# Patient Record
Sex: Male | Born: 1970 | Hispanic: No | Marital: Married | Smoking: Former smoker
Health system: Southern US, Community
[De-identification: ages and names within clinical notes are randomized; demographics above are authoritative.]

## PROBLEM LIST (undated history)

## (undated) DIAGNOSIS — Z87442 Personal history of urinary calculi: Secondary | ICD-10-CM

## (undated) DIAGNOSIS — Z8489 Family history of other specified conditions: Secondary | ICD-10-CM

## (undated) HISTORY — PX: ABDOMINAL SURGERY: SHX537

---
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
## 2013-03-04 DIAGNOSIS — D229 Melanocytic nevi, unspecified: Secondary | ICD-10-CM

## 2013-03-04 HISTORY — DX: Melanocytic nevi, unspecified: D22.9

## 2017-01-11 DIAGNOSIS — C4491 Basal cell carcinoma of skin, unspecified: Secondary | ICD-10-CM

## 2017-01-11 HISTORY — DX: Basal cell carcinoma of skin, unspecified: C44.91

## 2019-11-05 ENCOUNTER — Other Ambulatory Visit: Payer: Self-pay

## 2019-11-05 ENCOUNTER — Encounter: Payer: Self-pay | Admitting: Physician Assistant

## 2019-11-05 ENCOUNTER — Encounter (INDEPENDENT_AMBULATORY_CARE_PROVIDER_SITE_OTHER): Payer: Self-pay

## 2019-11-05 ENCOUNTER — Ambulatory Visit (INDEPENDENT_AMBULATORY_CARE_PROVIDER_SITE_OTHER): Payer: 59 | Admitting: Physician Assistant

## 2019-11-05 DIAGNOSIS — C44519 Basal cell carcinoma of skin of other part of trunk: Secondary | ICD-10-CM | POA: Diagnosis not present

## 2019-11-05 DIAGNOSIS — D492 Neoplasm of unspecified behavior of bone, soft tissue, and skin: Secondary | ICD-10-CM

## 2019-11-05 DIAGNOSIS — L570 Actinic keratosis: Secondary | ICD-10-CM | POA: Diagnosis not present

## 2019-11-05 NOTE — Patient Instructions (Signed)

## 2019-11-05 NOTE — Progress Notes (Addendum)
Follow-Up Visit   Subjective  Mathew Ramos is a 49 y.o. male who presents for the following: Skin Problem (Right cheek had for one month, no scal but does bleed everytime he shaves. Left shoulder had for 2 months, scaly but wont heal).  Location: back x 2, right cheek Duration: 1 month Quality: pink and scaly, non healing  Associated Signs/Symptoms: tender, bleeds Modifying Factors: persistent  The following portions of the chart were reviewed this encounter and updated as appropriate: Allergies  Meds  Problems  Med Hx  Surg Hx  Fam Hx  Soc Hx     Objective  Well appearing patient in no apparent distress; mood and affect are within normal limits.  All skin waist up examined. No atypical nevi noted at the time of the visit.  Right Parotid Area  Objective  Right Parotid Area: Scaly erythematous macule  Txpbx-curet& cautery 1.1cm  Images    Objective  Left Upper Back medial: Scaly erythematous macule  Txpbx-curet and cautery 1.1cm       Objective  left upper back lateral: Scaly erythematous macule  Txpbx-curet and cautery  1.1cm     Assessment & Plan  Actinic keratosis Right Parotid Area  Skin / nail biopsy - Right Parotid Area Type of biopsy: tangential   Informed consent: discussed and consent obtained   Timeout: patient name, date of birth, surgical site, and procedure verified   Procedure prep:  Patient was prepped and draped in usual sterile fashion Prep type:  Chlorhexidine Anesthesia: the lesion was anesthetized in a standard fashion   Anesthetic:  1% lidocaine w/ epinephrine 1-100,000 local infiltration Instrument used: flexible razor blade   Hemostasis achieved with: aluminum chloride   Outcome: patient tolerated procedure well   Post-procedure details: wound care instructions given   Additional details:  PERFORMED ED&C AFTER BIOPSY   Destruction of lesion - Right Parotid Area Complexity: extensive   Destruction method:  electrodesiccation and curettage   Informed consent: discussed and consent obtained   Timeout:  patient name, date of birth, surgical site, and procedure verified Procedure prep:  Patient was prepped and draped in usual sterile fashion Prep type:  Isopropyl alcohol Anesthesia: the lesion was anesthetized in a standard fashion   Anesthetic:  1% lidocaine w/ epinephrine 1-100,000 buffered w/ 8.4% NaHCO3 Curettage performed in three different directions: Yes   Electrodesiccation performed over the curetted area: Yes   Lesion length (cm):  1.1 Lesion width (cm):  1.1 Margin per side (cm):  0 Final wound size (cm):  1.1 Hemostasis achieved with:  pressure, aluminum chloride and electrodesiccation Outcome: patient tolerated procedure well with no complications   Post-procedure details: sterile dressing applied and wound care instructions given   Dressing type: bandage and petrolatum    Specimen 1 - Surgical pathology Differential Diagnosis: bcc Check Margins: No  BCC (basal cell carcinoma), back (2) Left Upper Back medial  Skin / nail biopsy Type of biopsy: tangential   Informed consent: discussed and consent obtained   Timeout: patient name, date of birth, surgical site, and procedure verified   Procedure prep:  Patient was prepped and draped in usual sterile fashion Prep type:  Chlorhexidine Anesthesia: the lesion was anesthetized in a standard fashion   Anesthetic:  1% lidocaine w/ epinephrine 1-100,000 local infiltration Instrument used: flexible razor blade   Hemostasis achieved with: aluminum chloride   Outcome: patient tolerated procedure well   Post-procedure details: wound care instructions given   Additional details:  PERFORMED ED&C AFTER BIOPSY  Destruction of lesion Complexity: extensive   Destruction method: electrodesiccation and curettage   Informed consent: discussed and consent obtained   Timeout:  patient name, date of birth, surgical site, and procedure  verified Procedure prep:  Patient was prepped and draped in usual sterile fashion Prep type:  Isopropyl alcohol Anesthesia: the lesion was anesthetized in a standard fashion   Anesthetic:  1% lidocaine w/ epinephrine 1-100,000 buffered w/ 8.4% NaHCO3 Curettage performed in three different directions: Yes   Electrodesiccation performed over the curetted area: Yes   Lesion length (cm):  1.1 Lesion width (cm):  1.1 Margin per side (cm):  0 Final wound size (cm):  1.1 Hemostasis achieved with:  pressure, aluminum chloride and electrodesiccation Outcome: patient tolerated procedure well with no complications   Post-procedure details: sterile dressing applied and wound care instructions given   Dressing type: bandage and petrolatum    Specimen 2 - Surgical pathology Differential Diagnosis: bcc Check Margins: No  left upper back lateral  Skin / nail biopsy Type of biopsy: tangential   Informed consent: discussed and consent obtained   Timeout: patient name, date of birth, surgical site, and procedure verified   Procedure prep:  Patient was prepped and draped in usual sterile fashion Prep type:  Chlorhexidine Anesthesia: the lesion was anesthetized in a standard fashion   Anesthetic:  1% lidocaine w/ epinephrine 1-100,000 local infiltration Instrument used: flexible razor blade   Hemostasis achieved with: aluminum chloride   Outcome: patient tolerated procedure well   Post-procedure details: wound care instructions given   Additional details:  PERFORMED ED&C AFTER BIOPSY   Destruction of lesion Complexity: extensive   Destruction method: electrodesiccation and curettage   Informed consent: discussed and consent obtained   Timeout:  patient name, date of birth, surgical site, and procedure verified Procedure prep:  Patient was prepped and draped in usual sterile fashion Prep type:  Isopropyl alcohol Anesthesia: the lesion was anesthetized in a standard fashion   Anesthetic:  1%  lidocaine w/ epinephrine 1-100,000 buffered w/ 8.4% NaHCO3 Curettage performed in three different directions: Yes   Electrodesiccation performed over the curetted area: Yes   Lesion length (cm):  1.1 Lesion width (cm):  1.1 Margin per side (cm):  0 Final wound size (cm):  1.1 Hemostasis achieved with:  pressure, aluminum chloride and electrodesiccation Outcome: patient tolerated procedure well with no complications   Post-procedure details: sterile dressing applied and wound care instructions given   Dressing type: bandage and petrolatum    Specimen 3 - Surgical pathology Differential Diagnosis: bcc Check Margins: No  Other Related Procedures Skin / nail biopsy Type of biopsy: tangential   Informed consent: discussed and consent obtained   Timeout: patient name, date of birth, surgical site, and procedure verified   Procedure prep:  Patient was prepped and draped in usual sterile fashion Prep type:  Chlorhexidine Anesthesia: the lesion was anesthetized in a standard fashion   Anesthetic:  1% lidocaine w/ epinephrine 1-100,000 local infiltration Instrument used: flexible razor blade   Hemostasis achieved with: aluminum chloride   Outcome: patient tolerated procedure well   Post-procedure details: wound care instructions given   Additional details:  PERFORMED ED&C AFTER BIOPSY  Destruction of lesion Complexity: extensive   Destruction method: electrodesiccation and curettage   Informed consent: discussed and consent obtained   Timeout:  patient name, date of birth, surgical site, and procedure verified Procedure prep:  Patient was prepped and draped in usual sterile fashion Prep type:  Isopropyl alcohol Anesthesia: the  lesion was anesthetized in a standard fashion   Anesthetic:  1% lidocaine w/ epinephrine 1-100,000 buffered w/ 8.4% NaHCO3 Curettage performed in three different directions: Yes   Electrodesiccation performed over the curetted area: Yes   Lesion length (cm):   1.1 Lesion width (cm):  1.1 Margin per side (cm):  0 Final wound size (cm):  1.1 Hemostasis achieved with:  pressure, aluminum chloride and electrodesiccation Outcome: patient tolerated procedure well with no complications   Post-procedure details: sterile dressing applied and wound care instructions given   Dressing type: bandage and petrolatum

## 2019-11-09 ENCOUNTER — Telehealth: Payer: Self-pay

## 2019-11-09 NOTE — Telephone Encounter (Signed)
-----   Message from Warren Danes, [place] sent at 11/06/2019  3:34 PM EDT ----- 1. Skin , right parotid area ACTINIC KERATOSIS 2. Skin , left upper back medial SUPERFICIAL BASAL CELL CARCINOMA 3. Skin , left upper back lateral BASAL CELL CARCINOMA, NODULAR PATTERN  All txpbx  recheck 6 months

## 2019-11-09 NOTE — Telephone Encounter (Signed)
Phone call to patient with his Pathology results.  Message left for patient to give [place] a call back.

## 2019-11-09 NOTE — Telephone Encounter (Signed)
Phone call from patient returning my call for his Pathology results.  Pathology results given to patient, 6 month follow up appointment made with [facility] on 04/26/2020 @ 8:15am.

## 2019-12-28 ENCOUNTER — Ambulatory Visit: Payer: 59 | Attending: Internal Medicine

## 2019-12-28 DIAGNOSIS — Z23 Encounter for immunization: Secondary | ICD-10-CM

## 2019-12-28 NOTE — Progress Notes (Signed)
   Covid-19 Vaccination Clinic  Name:  Mathew Ramos    MRN: [id number] DOB: 1971-03-12  12/28/2019  Mr. Mathew Ramos was observed post Covid-19 immunization for 15 minutes without incident. He was provided with Vaccine Information Sheet and instruction to access the V-Safe system.   Mr. Mathew Ramos was instructed to call 911 with any severe reactions post vaccine: Marland Kitchen Difficulty breathing  . Swelling of face and throat  . A fast heartbeat  . A bad rash all over body  . Dizziness and weakness   Immunizations Administered    Name Date Dose VIS Date Route   Pfizer COVID-19 Vaccine 12/28/2019  3:05 PM 0.3 mL 10/07/2018 Intramuscular   Manufacturer: [place]   Lot: [id number]   [place]: [id number]

## 2019-12-31 NOTE — Addendum Note (Signed)
Addended by: Robyne Askew R on: 12/31/2019 02:03 PM   Modules accepted: Level of Service

## 2020-01-18 ENCOUNTER — Ambulatory Visit: Payer: 59 | Attending: Internal Medicine

## 2020-01-18 DIAGNOSIS — Z23 Encounter for immunization: Secondary | ICD-10-CM

## 2020-01-18 NOTE — Progress Notes (Signed)
   Covid-19 Vaccination Clinic  Name:  Mathew Ramos    MRN: [id number] DOB: [birth year]  01/18/2020  [name] was observed post Covid-19 immunization for 15 minutes without incident. He was provided with Vaccine Information Sheet and instruction to access the V-Safe system.   [name] was instructed to call 911 with any severe reactions post vaccine: Marland Kitchen Difficulty breathing  . Swelling of face and throat  . A fast heartbeat  . A bad rash all over body  . Dizziness and weakness   Immunizations Administered    Name Date Dose VIS Date Route   Pfizer COVID-19 Vaccine 01/18/2020  1:27 PM 0.3 mL 10/07/2018 Intramuscular   Manufacturer: [employer]   Lot: [id number]   [place]: [id number]

## 2020-04-26 ENCOUNTER — Ambulatory Visit: Payer: 59 | Admitting: Physician Assistant

## 2020-06-29 ENCOUNTER — Ambulatory Visit: Payer: 59 | Admitting: Dermatology

## 2020-10-10 ENCOUNTER — Other Ambulatory Visit: Payer: Self-pay

## 2020-10-10 ENCOUNTER — Ambulatory Visit (INDEPENDENT_AMBULATORY_CARE_PROVIDER_SITE_OTHER): Payer: Managed Care, Other (non HMO) | Admitting: Physician Assistant

## 2020-10-10 ENCOUNTER — Encounter: Payer: Self-pay | Admitting: Physician Assistant

## 2020-10-10 DIAGNOSIS — Z86018 Personal history of other benign neoplasm: Secondary | ICD-10-CM | POA: Diagnosis not present

## 2020-10-10 DIAGNOSIS — L821 Other seborrheic keratosis: Secondary | ICD-10-CM

## 2020-10-10 DIAGNOSIS — C4492 Squamous cell carcinoma of skin, unspecified: Secondary | ICD-10-CM

## 2020-10-10 DIAGNOSIS — D0439 Carcinoma in situ of skin of other parts of face: Secondary | ICD-10-CM | POA: Diagnosis not present

## 2020-10-10 DIAGNOSIS — L570 Actinic keratosis: Secondary | ICD-10-CM

## 2020-10-10 DIAGNOSIS — Z85828 Personal history of other malignant neoplasm of skin: Secondary | ICD-10-CM

## 2020-10-10 DIAGNOSIS — D0430 Carcinoma in situ of skin of unspecified part of face: Secondary | ICD-10-CM

## 2020-10-10 DIAGNOSIS — D485 Neoplasm of uncertain behavior of skin: Secondary | ICD-10-CM

## 2020-10-10 HISTORY — DX: Squamous cell carcinoma of skin, unspecified: C44.92

## 2020-10-10 NOTE — Patient Instructions (Signed)

## 2020-10-17 ENCOUNTER — Telehealth: Payer: Self-pay | Admitting: *Deleted

## 2020-10-17 ENCOUNTER — Encounter: Payer: Self-pay | Admitting: *Deleted

## 2020-10-17 ENCOUNTER — Encounter: Payer: Self-pay | Admitting: Physician Assistant

## 2020-10-17 NOTE — Telephone Encounter (Signed)
Pathology to patient-surgery appointment scheduled.  

## 2020-10-17 NOTE — Progress Notes (Signed)
Follow-Up Visit   Subjective  Mathew Ramos is a 50 y.o. male who presents for the following: Follow-up (Follow up on bcc on shoulder, patient doesn't remember left or right. A couple of spots on face. Left side of temple-scaly, crusty, wont heal x 6 months./Bridge of nose-scaly crusty places that wont heal x 6 months. Per patient Left side of the nose x months depression in face ).   The following portions of the chart were reviewed this encounter and updated as appropriate:  Tobacco  Allergies  Meds  Problems  Med Hx  Surg Hx  Fam Hx      Objective  Well appearing patient in no apparent distress; mood and affect are within normal limits.  All skin waist up examined.  Objective  Left Nasal Sidewall: Pearly papule with telangectasia.        Objective  Left Malar Cheek: Pearly papule with telangectasia.        Objective  Mid Upper Back: scar  Objective  Right Upper Thigh: Dyspigmented scar.   Objective  Right Bridge of Nose: Pearly papule with telangectasia.        Objective  Left Temple: Pearly papule with telangectasia.        Assessment & Plan  Neoplasm of uncertain behavior of skin (2) Left Nasal Sidewall  Skin / nail biopsy Type of biopsy: tangential   Informed consent: discussed and consent obtained   Timeout: patient name, date of birth, surgical site, and procedure verified   Procedure prep:  Patient was prepped and draped in usual sterile fashion (Non sterile) Prep type:  Chlorhexidine Anesthesia: the lesion was anesthetized in a standard fashion   Anesthetic:  1% lidocaine w/ epinephrine 1-100,000 local infiltration Instrument used: flexible razor blade   Hemostasis achieved with: aluminum chloride   Outcome: patient tolerated procedure well   Post-procedure details: sterile dressing applied and wound care instructions given   Dressing type: bandage and petrolatum    Specimen 3 - Surgical pathology Differential  Diagnosis: R/O BCC vs SCC  Check Margins: No  Left Malar Cheek  Skin / nail biopsy Type of biopsy: tangential   Informed consent: discussed and consent obtained   Timeout: patient name, date of birth, surgical site, and procedure verified   Procedure prep:  Patient was prepped and draped in usual sterile fashion (Non sterile) Prep type:  Chlorhexidine Anesthesia: the lesion was anesthetized in a standard fashion   Anesthetic:  1% lidocaine w/ epinephrine 1-100,000 local infiltration Instrument used: flexible razor blade   Hemostasis achieved with: aluminum chloride   Outcome: patient tolerated procedure well   Post-procedure details: sterile dressing applied and wound care instructions given   Dressing type: bandage and petrolatum    Specimen 4 - Surgical pathology Differential Diagnosis: R/O BCC vs SCC  Check Margins: No  History of basal cell carcinoma (BCC) Mid Upper Back  observe  History of atypical skin mole Right Upper Thigh  observe  Carcinoma in situ of skin of face, 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  Skin / nail biopsy Type of biopsy: tangential   Informed consent: discussed and consent obtained   Timeout: patient name, date of birth, surgical site, and procedure verified   Procedure prep:  Patient was prepped and draped in usual sterile fashion (Non sterile) Prep type:  Chlorhexidine Anesthesia: the lesion was anesthetized in a standard fashion   Anesthetic:  1% lidocaine w/ epinephrine 1-100,000 local infiltration Instrument used: flexible razor blade   Hemostasis achieved  with: aluminum chloride   Outcome: patient tolerated procedure well   Post-procedure details: sterile dressing applied and wound care instructions given   Dressing type: bandage and petrolatum    Specimen 1 - Surgical pathology Differential Diagnosis: R/O BCC vs SCC  Check Margins: No  Left Temple  Skin / nail biopsy Type of biopsy: tangential   Informed consent:  discussed and consent obtained   Timeout: patient name, date of birth, surgical site, and procedure verified   Procedure prep:  Patient was prepped and draped in usual sterile fashion (Non sterile) Prep type:  Chlorhexidine Anesthesia: the lesion was anesthetized in a standard fashion   Anesthetic:  1% lidocaine w/ epinephrine 1-100,000 local infiltration Instrument used: flexible razor blade   Hemostasis achieved with: aluminum chloride   Outcome: patient tolerated procedure well   Post-procedure details: sterile dressing applied and wound care instructions given   Dressing type: bandage and petrolatum    Specimen 2 - Surgical pathology Differential Diagnosis: R/O BCC vs SCC  Check Margins: No   I, Juanluis Guastella, PA-C, have reviewed all documentation's for this visit.  The documentation on 10/17/20 for the exam, diagnosis, procedures and orders are all accurate and complete.

## 2021-01-12 ENCOUNTER — Other Ambulatory Visit: Payer: Self-pay

## 2021-01-12 ENCOUNTER — Ambulatory Visit (INDEPENDENT_AMBULATORY_CARE_PROVIDER_SITE_OTHER): Payer: Managed Care, Other (non HMO) | Admitting: Physician Assistant

## 2021-01-12 ENCOUNTER — Encounter: Payer: Self-pay | Admitting: Physician Assistant

## 2021-01-12 DIAGNOSIS — D0439 Carcinoma in situ of skin of other parts of face: Secondary | ICD-10-CM

## 2021-01-12 DIAGNOSIS — D0430 Carcinoma in situ of skin of unspecified part of face: Secondary | ICD-10-CM

## 2021-01-12 NOTE — Progress Notes (Signed)
   Follow-Up Visit   Subjective  Mathew Ramos is a 50 y.o. male who presents for the following: Procedure (Cis x2 path in room).   The following portions of the chart were reviewed this encounter and updated as appropriate:      Objective  Well appearing patient in no apparent distress; mood and affect are within normal limits.  A focused examination was performed including face. Relevant physical exam findings are noted in the Assessment and Plan.  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  Destruction of lesion Complexity: simple   Destruction method: electrodesiccation and curettage   Informed consent: discussed and consent obtained   Timeout:  patient name, date of birth, surgical site, and procedure verified Anesthesia: the lesion was anesthetized in a standard fashion   Anesthetic:  1% lidocaine w/ epinephrine 1-100,000 local infiltration Curettage performed in three different directions: Yes   Electrodesiccation performed over the curetted area: Yes   Curettage cycles:  3 Margin per side (cm):  0.1 Final wound size (cm):  1.2 Hemostasis achieved with:  ferric subsulfate and electrodesiccation Outcome: patient tolerated procedure well with no complications   Post-procedure details: sterile dressing applied and wound care instructions given   Dressing type: bandage and petrolatum   Additional details:  Wound innoculated with 5 fluorouracil solution.  Left Temple  Destruction of lesion Complexity: simple   Destruction method: electrodesiccation and curettage   Informed consent: discussed and consent obtained   Timeout:  patient name, date of birth, surgical site, and procedure verified Anesthesia: the lesion was anesthetized in a standard fashion   Anesthetic:  1% lidocaine w/ epinephrine 1-100,000 local  infiltration Curettage performed in three different directions: Yes   Electrodesiccation performed over the curetted area: Yes   Curettage cycles:  3 Margin per side (cm):  0.1 Final wound size (cm):  1 Hemostasis achieved with:  ferric subsulfate Outcome: patient tolerated procedure well with no complications   Post-procedure details: sterile dressing applied and wound care instructions given   Dressing type: bandage and petrolatum   Additional details:  Wound inoculated with 5% fluorouracil solution   I, Cherish Runde, PA-C, have reviewed all documentation's for this visit.  The documentation on 01/12/21 for the exam, diagnosis, procedures and orders are all accurate and complete.

## 2021-01-12 NOTE — Patient Instructions (Signed)

## 2021-01-17 ENCOUNTER — Other Ambulatory Visit: Payer: Self-pay

## 2021-01-17 ENCOUNTER — Emergency Department (HOSPITAL_BASED_OUTPATIENT_CLINIC_OR_DEPARTMENT_OTHER): Payer: Managed Care, Other (non HMO) | Admitting: Radiology

## 2021-01-17 ENCOUNTER — Emergency Department (HOSPITAL_BASED_OUTPATIENT_CLINIC_OR_DEPARTMENT_OTHER)
Admission: EM | Admit: 2021-01-17 | Discharge: 2021-01-17 | Disposition: A | Discharge status: Home / Self Care | Payer: Managed Care, Other (non HMO) | Attending: Emergency Medicine | Admitting: Emergency Medicine

## 2021-01-17 ENCOUNTER — Encounter (HOSPITAL_BASED_OUTPATIENT_CLINIC_OR_DEPARTMENT_OTHER): Payer: Self-pay

## 2021-01-17 DIAGNOSIS — Y9389 Activity, other specified: Secondary | ICD-10-CM | POA: Diagnosis not present

## 2021-01-17 DIAGNOSIS — Z87891 Personal history of nicotine dependence: Secondary | ICD-10-CM | POA: Diagnosis not present

## 2021-01-17 DIAGNOSIS — W312XXA Contact with powered woodworking and forming machines, initial encounter: Secondary | ICD-10-CM | POA: Insufficient documentation

## 2021-01-17 DIAGNOSIS — Z85828 Personal history of other malignant neoplasm of skin: Secondary | ICD-10-CM | POA: Diagnosis not present

## 2021-01-17 DIAGNOSIS — S61211A Laceration without foreign body of left index finger without damage to nail, initial encounter: Secondary | ICD-10-CM | POA: Diagnosis present

## 2021-01-17 NOTE — Discharge Instructions (Addendum)
Wound care as we discussed.  Apply antibiotic ointment at least once daily with nonadherent dressing.  Wash with soap and water in between.  You can make an appointment to follow-up with hand surgery but most likely this wound will heal fine on its own.

## 2021-01-17 NOTE — ED Triage Notes (Signed)
Cut left index finger on a radial arm saw last night at 1800, not currently bleeding.

## 2021-01-17 NOTE — ED Notes (Signed)
Laceration to left index finger irrigated with 100 ml NS, antibiotic ointment applied, non adherent dressing applied, wrapped with kerlix and coban.  Patient educated on wound care and dressing changes, verbalized understanding.

## 2021-01-17 NOTE — ED Provider Notes (Signed)
[facility] EMERGENCY DEPT Provider Note   CSN: [id number] Arrival date & time: 01/17/21  3664     History Chief Complaint  Patient presents with  . Laceration    Mathew Ramos is a 50 y.o. male.  Patient last evening was cutting wood with radial salt.  And he is took a chunk of skin out of his radial side of his left index finger.  He is right-hand dominant.  Patient's tetanus is up-to-date.  Bleeding controlled with pressure at home.  Patient thinks that actually the skin was actually removed from the radial salt.  Because it was a skiving type injury.  No other injuries.        Past Medical History:  Diagnosis Date  . Atypical mole 03/04/2013   moderate-R post shoulder, mod-severe-Right upper thingh(exc)  . Atypical mole 08/21/2013   mild-Left post calf  . Atypical mole 08/03/2014   mild- R outer calf  . Basal cell carcinoma 01/11/2017   sup-mid upper back (CX35FU)  . Squamous cell carcinoma of skin 10/10/2020   in situ-right bridge of nose (curet and 5FU)  . Squamous cell carcinoma of skin 10/10/2020   in situ-left temple (curet and 5FU)    There are no problems to display for this patient.   History reviewed. No pertinent surgical history.     History reviewed. No pertinent family history.  Social History   Tobacco Use  . Smoking status: Former Smoker    Quit date: 11/05/2003    Years since quitting: 17.2  . Smokeless tobacco: Never Used  Vaping Use  . Vaping Use: Never used  Substance Use Topics  . Alcohol use: Yes    Comment: socially  . Drug use: Never    Home Medications Prior to Admission medications   Medication Sig Start Date End Date Taking? Authorizing Provider  atorvastatin (LIPITOR) 20 MG tablet Take by mouth. 11/02/19   [provider]  tadalafil (CIALIS) 20 MG tablet Take 20 mg by mouth daily as needed. 10/02/20   [provider]    Allergies    Patient has no known allergies.  Review of Systems    Review of Systems  Constitutional: Negative for chills and fever.  HENT: Negative for rhinorrhea and sore throat.   Eyes: Negative for visual disturbance.  Respiratory: Negative for cough and shortness of breath.   Cardiovascular: Negative for chest pain and leg swelling.  Gastrointestinal: Negative for abdominal pain, diarrhea, nausea and vomiting.  Genitourinary: Negative for dysuria.  Musculoskeletal: Negative for back pain and neck pain.  Skin: Positive for wound. Negative for rash.  Neurological: Negative for dizziness, light-headedness and headaches.  Hematological: Does not bruise/bleed easily.  Psychiatric/Behavioral: Negative for confusion.    Physical Exam Updated Vital Signs BP 114/82 (BP Location: Right Arm)   Pulse 67   Temp 98.6 F (37 C) (Oral)   Resp 16   Ht 1.905 m (6\' 3" )   Wt 77.1 kg   SpO2 98%   BMI 21.25 kg/m   Physical Exam Vitals and nursing note reviewed.  Constitutional:      Appearance: He is well-developed.  HENT:     Head: Normocephalic and atraumatic.  Eyes:     Extraocular Movements: Extraocular movements intact.     Conjunctiva/sclera: Conjunctivae normal.     Pupils: Pupils are equal, round, and reactive to light.  Cardiovascular:     Rate and Rhythm: Normal rate and regular rhythm.     Heart sounds: No murmur heard.  Pulmonary:     Effort: Pulmonary effort is normal. No respiratory distress.     Breath sounds: Normal breath sounds.  Abdominal:     Palpations: Abdomen is soft.     Tenderness: There is no abdominal tenderness.  Musculoskeletal:        General: Signs of injury present. Normal range of motion.     Cervical back: Normal range of motion and neck supple.     Comments: Left index 2 cm x 1 cm avulsion type injury no skin flap.  Good cap refill distally sensation intact distally.  Location of the injury is on the radial side of the index finger just distal to the PIP joint.  No exposed bone.  No exposed joint space.  Good  range of motion at MCP PIP and DIP joint.  Not actively bleeding.  Wound base is moist  Skin:    General: Skin is warm and dry.  Neurological:     General: No focal deficit present.     Mental Status: He is alert and oriented to person, place, and time.     Cranial Nerves: No cranial nerve deficit.     Sensory: No sensory deficit.     Motor: No weakness.     ED Results / Procedures / Treatments   Labs (all labs ordered are listed, but only abnormal results are displayed) Labs Reviewed - No data to display  EKG None  Radiology No results found.  Procedures Procedures  Medications Ordered in ED Medications - No data to display  ED Course  I have reviewed the triage vital signs and the nursing notes.  Pertinent labs & imaging results that were available during my care of the patient were reviewed by me and considered in my medical decision making (see chart for details).    MDM Rules/Calculators/A&P                          Will get x-ray of the left index finger.  But clinically unlikely to involve the bone.  But also just to rule out any foreign body even though base of the wound is well visualized.  Does not involve joint space clinically.  There is no skin to close.  The chunk of skin was removed by the radial salt.  It will heal by secondary intention.  Patient's tetanus up-to-date.  Will treat with antibiotic ointment and wound care.  X-ray without any evidence of fracture or foreign body.  Interpreted by me.   Final Clinical Impression(s) / ED Diagnoses Final diagnoses:  Laceration of left index finger without foreign body without damage to nail, initial encounter    Rx / DC Orders ED Discharge Orders    None       Fredia Sorrow, MD 01/17/21 [phone]

## 2021-05-16 ENCOUNTER — Encounter: Payer: Self-pay | Admitting: Physician Assistant

## 2021-05-16 ENCOUNTER — Ambulatory Visit (INDEPENDENT_AMBULATORY_CARE_PROVIDER_SITE_OTHER): Payer: Managed Care, Other (non HMO) | Admitting: Physician Assistant

## 2021-05-16 ENCOUNTER — Other Ambulatory Visit: Payer: Self-pay

## 2021-05-16 DIAGNOSIS — Z86006 Personal history of melanoma in-situ: Secondary | ICD-10-CM | POA: Diagnosis not present

## 2021-05-16 DIAGNOSIS — L570 Actinic keratosis: Secondary | ICD-10-CM

## 2021-05-16 MED ORDER — FLUOROURACIL 5 % EX CREA: TOPICAL_CREAM | CUTANEOUS | 0 refills | Status: DC

## 2021-05-17 ENCOUNTER — Encounter: Payer: Self-pay | Admitting: Physician Assistant

## 2021-05-17 NOTE — Progress Notes (Signed)
   Follow-Up Visit   Subjective  Mathew Ramos is a 50 y.o. 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 Per patient everything healed well.  No new concerns. ).   The following portions of the chart were reviewed this encounter and updated as appropriate:  Tobacco  Allergies  Meds  Problems  Med Hx  Surg Hx  Fam Hx      Objective  Well appearing patient in no apparent distress; mood and affect are within normal limits.  A focused examination was performed including face and neck. Relevant physical exam findings are noted in the Assessment and Plan.  Head - Anterior (Face) Erythematous patches with gritty scale.   Assessment & Plan  AK (actinic keratosis) Head - Anterior (Face)  fluorouracil (EFUDEX) 5 % cream - Head - Anterior (Face) Apply thin film nightly for 2 weeks (sunscreen daily). Wash off in the morning. Avoid the sun.    I, Jema Deegan, PA-C, have reviewed all documentation's for this visit.  The documentation on 05/17/21 for the exam, diagnosis, procedures and orders are all accurate and complete.

## 2022-06-03 IMAGING — DX DG FINGER INDEX 2+V*L*
1 series · 3 of 3 positions shown · non-contrast
Comparison: None.

CLINICAL DATA: Left index finger injury.

EXAM:
LEFT INDEX FINGER 2+V

[Series 1: finger · 0.14mm/px · 3 of 3 slices shown]
[im 1/3]
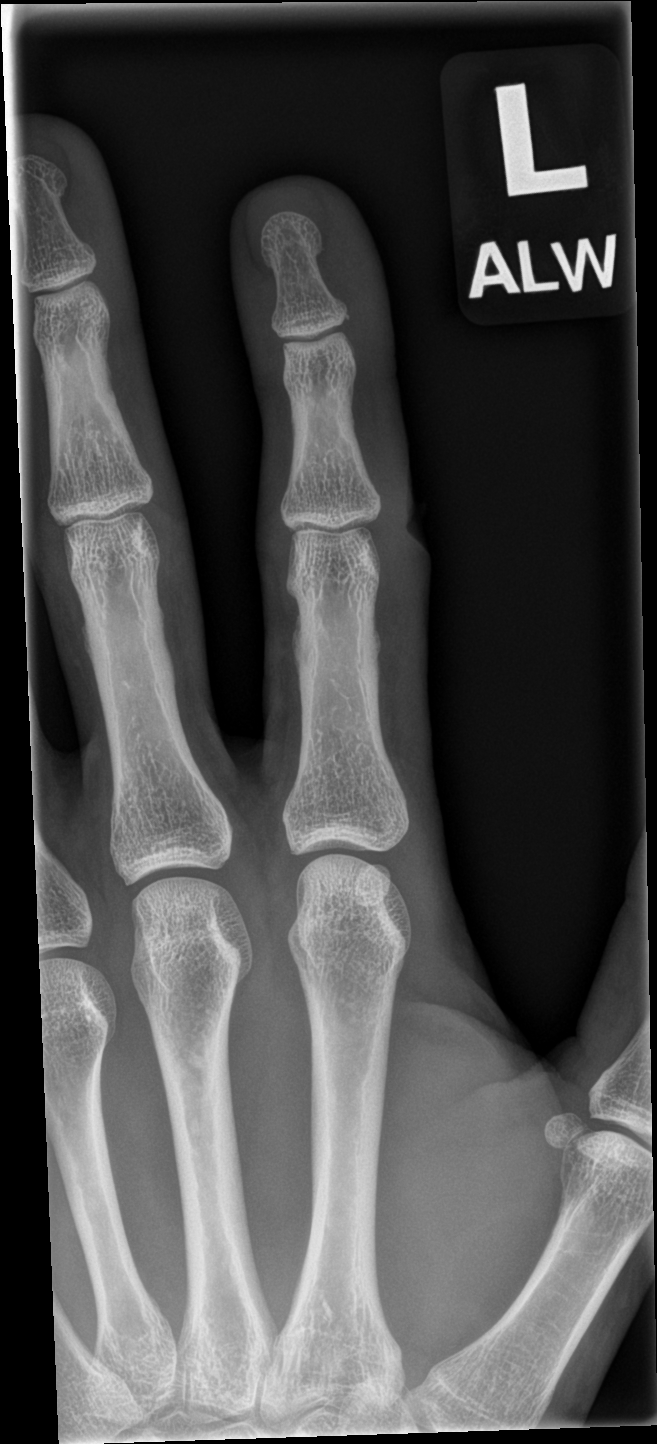
[im 2/3]
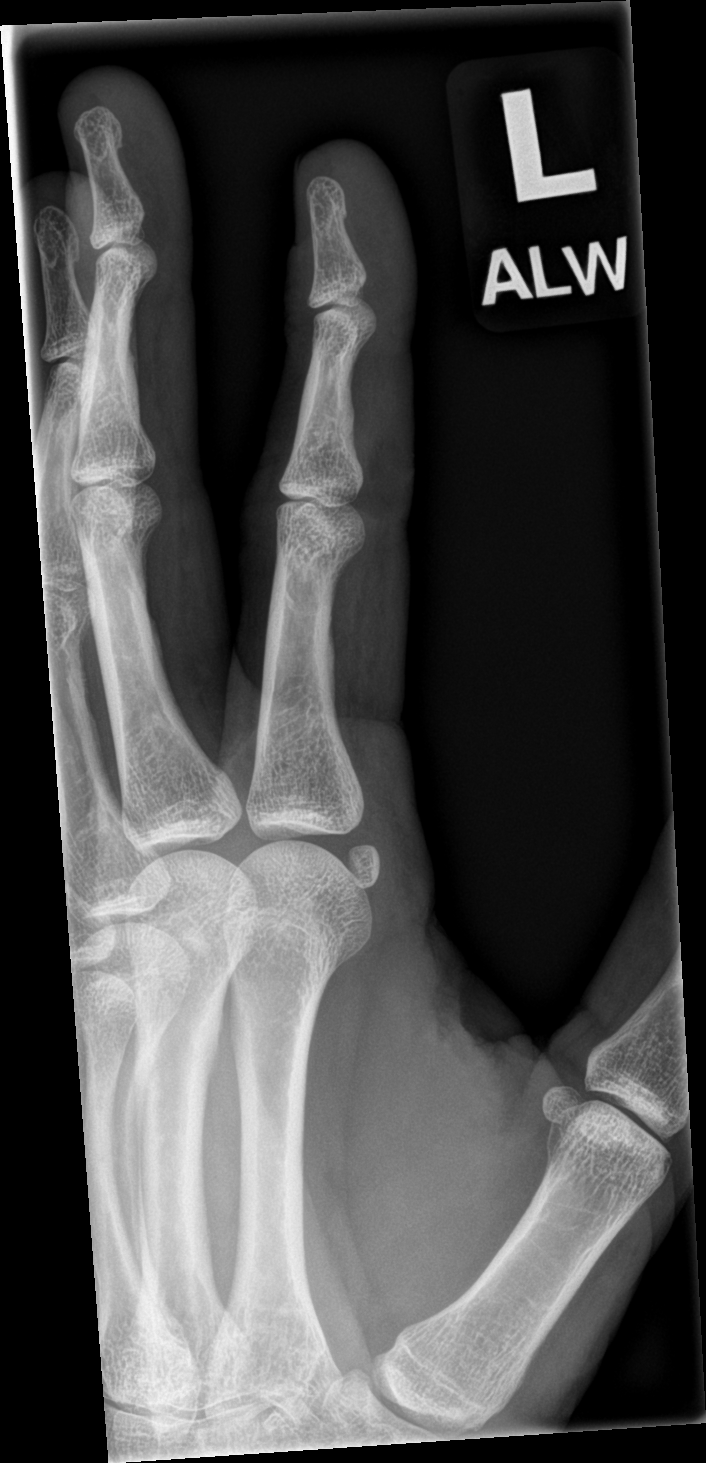
[im 3/3]
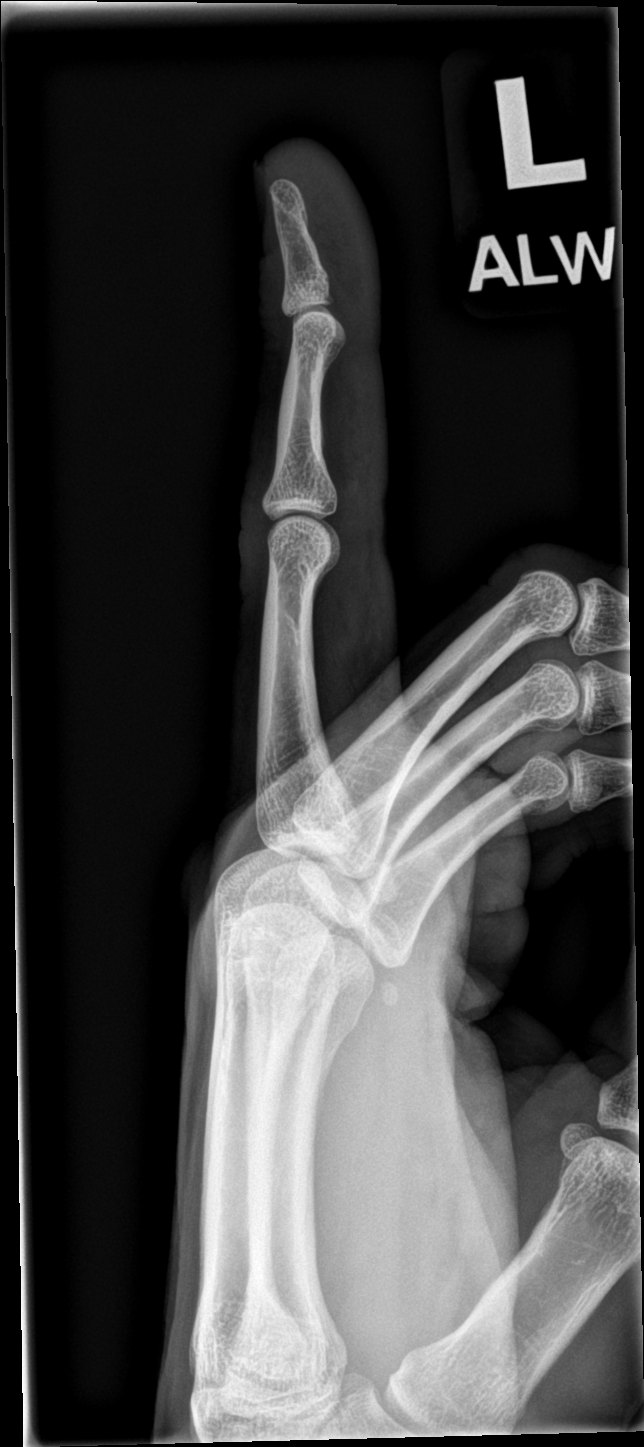

[3 of 3 positions shown; findings below may reference images not displayed]

FINDINGS: There is no evidence of fracture or dislocation. There is no
evidence of arthropathy or other focal bone abnormality. Soft
tissues are unremarkable.
IMPRESSION: Negative.

## 2022-08-13 HISTORY — PX: COLONOSCOPY: SHX174

## 2023-05-29 ENCOUNTER — Ambulatory Visit: Payer: Self-pay | Admitting: Surgery
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
## 2023-07-26 NOTE — Pre-Procedure Instructions (Signed)
Surgical Instructions   Your procedure is scheduled on July 30, 2023. Report to [facility] Main Entrance "A" at 5:30 A.M., then check in with the Admitting office. Any questions or running late day of surgery: call [phone]  Questions prior to your surgery date: call [phone], Monday-Friday, 8am-4pm. If you experience any cold or flu symptoms such as cough, fever, chills, shortness of breath, etc. between now and your scheduled surgery, please notify us at the above number.     Remember:  Do not eat after midnight the night before your surgery   You may drink clear liquids until 4:30 AM the morning of your surgery.   Clear liquids allowed are: Water, Non-Citrus Juices (without pulp), Carbonated Beverages, Clear Tea (no milk, honey, etc.), Black Coffee Only (NO MILK, CREAM OR POWDERED CREAMER of any kind), and Gatorade.    Take these medicines the morning of surgery with A SIP OF WATER: atorvastatin (LIPITOR)    One week prior to surgery, STOP taking any Aspirin (unless otherwise instructed by your surgeon) Aleve, Naproxen, Ibuprofen, Motrin, Advil, Goody's, BC's, all herbal medications, fish oil, and non-prescription vitamins.                     Do NOT Smoke (Tobacco/Vaping) for 24 hours prior to your procedure.  If you use a CPAP at night, you may bring your mask/headgear for your overnight stay.   You will be asked to remove any contacts, glasses, piercing's, hearing aid's, dentures/partials prior to surgery. Please bring cases for these items if needed.    Patients discharged the day of surgery will not be allowed to drive home, and someone needs to stay with them for 24 hours.  SURGICAL WAITING ROOM VISITATION Patients may have no more than 2 support people in the waiting area - these visitors may rotate.   Pre-op nurse will coordinate an appropriate time for 1 ADULT support person, who may not rotate, to accompany patient in pre-op.  Children under the age of 13  must have an adult with them who is not the patient and must remain in the main waiting area with an adult.  If the patient needs to stay at the hospital during part of their recovery, the visitor guidelines for inpatient rooms apply.  Please refer to the [facility] website for the visitor guidelines for any additional information.   If you received a COVID test during your pre-op visit  it is requested that you wear a mask when out in public, stay away from anyone that may not be feeling well and notify your surgeon if you develop symptoms. If you have been in contact with anyone that has tested positive in the last 10 days please notify you surgeon.      Pre-operative CHG Bathing Instructions   You can play a key role in reducing the risk of infection after surgery. Your skin needs to be as free of germs as possible. You can reduce the number of germs on your skin by washing with CHG (chlorhexidine gluconate) soap before surgery. CHG is an antiseptic soap that kills germs and continues to kill germs even after washing.   DO NOT use if you have an allergy to chlorhexidine/CHG or antibacterial soaps. If your skin becomes reddened or irritated, stop using the CHG and notify one of our RNs at [phone].              TAKE A SHOWER THE NIGHT BEFORE SURGERY AND THE DAY  OF SURGERY    Please keep in mind the following:  DO NOT shave, including legs and underarms, 48 hours prior to surgery.   You may shave your face before/day of surgery.  Place clean sheets on your bed the night before surgery Use a clean washcloth (not used since being washed) for each shower. DO NOT sleep with pet's night before surgery.  CHG Shower Instructions:  Wash your face and private area with normal soap. If you choose to wash your hair, wash first with your normal shampoo.  After you use shampoo/soap, rinse your hair and body thoroughly to remove shampoo/soap residue.  Turn the water OFF and apply half the  bottle of CHG soap to a CLEAN washcloth.  Apply CHG soap ONLY FROM YOUR NECK DOWN TO YOUR TOES (washing for 3-5 minutes)  DO NOT use CHG soap on face, private areas, open wounds, or sores.  Pay special attention to the area where your surgery is being performed.  If you are having back surgery, having someone wash your back for you may be helpful. Wait 2 minutes after CHG soap is applied, then you may rinse off the CHG soap.  Pat dry with a clean towel  Put on clean pajamas    Additional instructions for the day of surgery: DO NOT APPLY any lotions, deodorants, cologne, or perfumes.   Do not wear jewelry or makeup Do not wear nail polish, gel polish, artificial nails, or any other type of covering on natural nails (fingers and toes) Do not bring valuables to the hospital. [facility] is not responsible for valuables/personal belongings. Put on clean/comfortable clothes.  Please brush your teeth.  Ask your nurse before applying any prescription medications to the skin.

## 2023-07-29 ENCOUNTER — Other Ambulatory Visit: Payer: Self-pay

## 2023-07-29 ENCOUNTER — Encounter (HOSPITAL_COMMUNITY)
Admission: RE | Admit: 2023-07-29 | Discharge: 2023-07-29 | Disposition: A | Discharge status: Home / Self Care | Payer: Managed Care, Other (non HMO) | Source: Ambulatory Visit | Attending: Surgery | Admitting: Surgery

## 2023-07-29 ENCOUNTER — Encounter (HOSPITAL_COMMUNITY): Payer: Self-pay

## 2023-07-29 VITALS — BP 127/85 | HR 66 | Temp 97.9°F | Resp 17 | Ht 74.0 in | Wt 170.5 lb

## 2023-07-29 DIAGNOSIS — Z01812 Encounter for preprocedural laboratory examination: Secondary | ICD-10-CM | POA: Diagnosis not present

## 2023-07-29 DIAGNOSIS — Z01818 Encounter for other preprocedural examination: Secondary | ICD-10-CM | POA: Diagnosis present

## 2023-07-29 HISTORY — DX: Personal history of urinary calculi: Z87.442

## 2023-07-29 HISTORY — DX: Family history of other specified conditions: Z84.89

## 2023-07-29 LAB — COMPREHENSIVE METABOLIC PANEL
ALT: 28 U/L (ref 0–44)
AST: 23 U/L (ref 15–41)
Albumin: 4.1 g/dL (ref 3.5–5.0)
Alkaline Phosphatase: 45 U/L (ref 38–126)
Anion gap: 8 (ref 5–15)
BUN: 18 mg/dL (ref 6–20)
CO2: 27 mmol/L (ref 22–32)
Calcium: 9.5 mg/dL (ref 8.9–10.3)
Chloride: 106 mmol/L (ref 98–111)
Creatinine, Ser: 0.91 mg/dL (ref 0.61–1.24)
GFR, Estimated: >60 mL/min (ref >60)
Glucose, Bld: 95 mg/dL (ref 70–99)
Potassium: 4.3 mmol/L (ref 3.5–5.1)
Sodium: 141 mmol/L (ref 135–145)
Total Bilirubin: 1 mg/dL (ref <1.2)
Total Protein: 6.9 g/dL (ref 6.5–8.1)

## 2023-07-29 LAB — CBC
HCT: 41.6 % (ref 39.0–52.0)
Hemoglobin: 13.6 g/dL (ref 13.0–17.0)
MCH: 31.1 pg (ref 26.0–34.0)
MCHC: 32.7 g/dL (ref 30.0–36.0)
MCV: 95.2 fL (ref 80.0–100.0)
Platelets: 268 10*3/uL (ref 150–400)
RBC: 4.37 MIL/uL (ref 4.22–5.81)
RDW: 12.3 % (ref 11.5–15.5)
WBC: 7 10*3/uL (ref 4.0–10.5)
nRBC: 0 % (ref 0.0–0.2)

## 2023-07-29 NOTE — Progress Notes (Signed)
PCP - Ladora Daniel, PA-C Cardiologist - Denies  PPM/ICD - Denies Device Orders - n/a Rep Notified - n/a  Chest x-ray - n/a EKG - Denies Stress Test - Denies ECHO - Denies Cardiac Cath - Denies  Sleep Study - Denies CPAP - n/a  No DM  Last dose of GLP1 agonist- n/a GLP1 instructions: n/a  Blood Thinner Instructions: n/a Aspirin Instructions: n/a  ERAS Protcol - Clear liquids until 0430 morning of surgery PRE-SURGERY Ensure or G2- n/a  COVID TEST- n/a   Anesthesia review: No  Patient denies shortness of breath, fever, cough and chest pain at PAT appointment. Pt denies any respiratory illness/infection in the last two months.    All instructions explained to the patient, with a verbal understanding of the material. Patient agrees to go over the instructions while at home for a better understanding. Patient also instructed to self quarantine after being tested for COVID-19. The opportunity to ask questions was provided.

## 2023-07-30 ENCOUNTER — Other Ambulatory Visit: Payer: Self-pay

## 2023-07-30 ENCOUNTER — Ambulatory Visit (HOSPITAL_BASED_OUTPATIENT_CLINIC_OR_DEPARTMENT_OTHER): Payer: Self-pay | Admitting: Anesthesiology

## 2023-07-30 ENCOUNTER — Ambulatory Visit (HOSPITAL_COMMUNITY): Payer: Self-pay | Admitting: Anesthesiology

## 2023-07-30 ENCOUNTER — Encounter (HOSPITAL_COMMUNITY)
Admission: RE | Disposition: A | Discharge status: Home / Self Care | Payer: Self-pay | Source: Home / Self Care | Attending: Surgery

## 2023-07-30 ENCOUNTER — Ambulatory Visit (HOSPITAL_COMMUNITY)
Admission: RE | Admit: 2023-07-30 | Discharge: 2023-07-30 | Disposition: A | Discharge status: Home / Self Care | Payer: Managed Care, Other (non HMO) | Attending: Surgery | Admitting: Surgery

## 2023-07-30 ENCOUNTER — Encounter (HOSPITAL_COMMUNITY): Payer: Self-pay | Admitting: Surgery

## 2023-07-30 ENCOUNTER — Ambulatory Visit (HOSPITAL_COMMUNITY): Payer: Managed Care, Other (non HMO)

## 2023-07-30 DIAGNOSIS — Z87891 Personal history of nicotine dependence: Secondary | ICD-10-CM | POA: Insufficient documentation

## 2023-07-30 DIAGNOSIS — K219 Gastro-esophageal reflux disease without esophagitis: Secondary | ICD-10-CM | POA: Insufficient documentation

## 2023-07-30 DIAGNOSIS — K760 Fatty (change of) liver, not elsewhere classified: Secondary | ICD-10-CM | POA: Diagnosis not present

## 2023-07-30 DIAGNOSIS — K8010 Calculus of gallbladder with chronic cholecystitis without obstruction: Secondary | ICD-10-CM | POA: Diagnosis present

## 2023-07-30 DIAGNOSIS — K828 Other specified diseases of gallbladder: Secondary | ICD-10-CM | POA: Insufficient documentation

## 2023-07-30 DIAGNOSIS — R0683 Snoring: Secondary | ICD-10-CM | POA: Diagnosis not present

## 2023-07-30 DIAGNOSIS — K8018 Calculus of gallbladder with other cholecystitis without obstruction: Secondary | ICD-10-CM

## 2023-07-30 DIAGNOSIS — K7689 Other specified diseases of liver: Secondary | ICD-10-CM | POA: Diagnosis not present

## 2023-07-30 DIAGNOSIS — Z79899 Other long term (current) drug therapy: Secondary | ICD-10-CM | POA: Insufficient documentation

## 2023-07-30 HISTORY — PX: CHOLECYSTECTOMY: SHX55

## 2023-07-30 HISTORY — PX: LAPAROSCOPIC LYSIS OF ADHESIONS: SHX5905

## 2023-07-30 SURGERY — LAPAROSCOPIC CHOLECYSTECTOMY WITH INTRAOPERATIVE CHOLANGIOGRAM
Anesthesia: General | Site: Abdomen

## 2023-07-30 MED ORDER — PROPOFOL 10 MG/ML IV BOLUS
INTRAVENOUS | Status: AC
  Filled 2023-07-30: qty 20

## 2023-07-30 MED ORDER — CEFAZOLIN SODIUM-DEXTROSE 2-4 GM/100ML-% IV SOLN
2.0000 g | INTRAVENOUS | Status: AC
  Administered 2023-07-30: 2 g via INTRAVENOUS
  Filled 2023-07-30: qty 100

## 2023-07-30 MED ORDER — SODIUM CHLORIDE 0.9 % IV SOLN
INTRAVENOUS | Status: DC | PRN
  Administered 2023-07-30: 15 mL

## 2023-07-30 MED ORDER — HYDROMORPHONE HCL 1 MG/ML IJ SOLN: 0.2500 mg | INTRAMUSCULAR | Status: DC | PRN

## 2023-07-30 MED ORDER — ACETAMINOPHEN 500 MG PO TABS
1000.0000 mg | ORAL_TABLET | ORAL | Status: AC
  Administered 2023-07-30: 1000 mg via ORAL
  Filled 2023-07-30: qty 2

## 2023-07-30 MED ORDER — KETOROLAC TROMETHAMINE 30 MG/ML IJ SOLN: 30.0000 mg | Freq: Once | INTRAMUSCULAR | Status: DC | PRN

## 2023-07-30 MED ORDER — ONDANSETRON HCL 4 MG/2ML IJ SOLN
INTRAMUSCULAR | Status: AC
  Filled 2023-07-30: qty 2

## 2023-07-30 MED ORDER — SODIUM CHLORIDE 0.9 % IR SOLN
Status: DC | PRN
  Administered 2023-07-30: 1000 mL

## 2023-07-30 MED ORDER — OXYCODONE HCL 5 MG PO TABS: 5.0000 mg | ORAL_TABLET | Freq: Four times a day (QID) | ORAL | 0 refills | Status: AC | PRN

## 2023-07-30 MED ORDER — MIDAZOLAM HCL 2 MG/2ML IJ SOLN
INTRAMUSCULAR | Status: AC
  Filled 2023-07-30: qty 2

## 2023-07-30 MED ORDER — PHENYLEPHRINE 80 MCG/ML (10ML) SYRINGE FOR IV PUSH (FOR BLOOD PRESSURE SUPPORT)
PREFILLED_SYRINGE | INTRAVENOUS | Status: AC
  Filled 2023-07-30: qty 10

## 2023-07-30 MED ORDER — LACTATED RINGERS IV SOLN: INTRAVENOUS | Status: DC

## 2023-07-30 MED ORDER — BUPIVACAINE-EPINEPHRINE 0.25% -1:200000 IJ SOLN
INTRAMUSCULAR | Status: DC | PRN
  Administered 2023-07-30: 10 mL

## 2023-07-30 MED ORDER — FENTANYL CITRATE (PF) 250 MCG/5ML IJ SOLN
INTRAMUSCULAR | Status: DC | PRN
  Administered 2023-07-30: 100 ug via INTRAVENOUS

## 2023-07-30 MED ORDER — CHLORHEXIDINE GLUCONATE 0.12 % MT SOLN
15.0000 mL | Freq: Once | OROMUCOSAL | Status: AC
  Administered 2023-07-30: 15 mL via OROMUCOSAL
  Filled 2023-07-30: qty 15

## 2023-07-30 MED ORDER — KETAMINE HCL 50 MG/5ML IJ SOSY
PREFILLED_SYRINGE | INTRAMUSCULAR | Status: DC | PRN
  Administered 2023-07-30: 35 mg via INTRAVENOUS

## 2023-07-30 MED ORDER — OXYCODONE HCL 5 MG PO TABS
5.0000 mg | ORAL_TABLET | Freq: Once | ORAL | Status: DC | PRN
Start: 2023-07-30 — End: 2023-07-30

## 2023-07-30 MED ORDER — CHLORHEXIDINE GLUCONATE CLOTH 2 % EX PADS: 6.0000 | MEDICATED_PAD | Freq: Once | CUTANEOUS | Status: DC

## 2023-07-30 MED ORDER — DEXAMETHASONE SODIUM PHOSPHATE 10 MG/ML IJ SOLN
INTRAMUSCULAR | Status: DC | PRN
  Administered 2023-07-30: 10 mg via INTRAVENOUS

## 2023-07-30 MED ORDER — LIDOCAINE 2% (20 MG/ML) 5 ML SYRINGE
INTRAMUSCULAR | Status: DC | PRN
  Administered 2023-07-30: 60 mg via INTRAVENOUS

## 2023-07-30 MED ORDER — PROPOFOL 10 MG/ML IV BOLUS
INTRAVENOUS | Status: AC
Start: 2023-07-30 — End: ?
  Filled 2023-07-30: qty 20

## 2023-07-30 MED ORDER — KETOROLAC TROMETHAMINE 15 MG/ML IJ SOLN
INTRAMUSCULAR | Status: DC | PRN
  Administered 2023-07-30: 15 mg via INTRAVENOUS

## 2023-07-30 MED ORDER — BUPIVACAINE-EPINEPHRINE (PF) 0.25% -1:200000 IJ SOLN
INTRAMUSCULAR | Status: AC
  Filled 2023-07-30: qty 30

## 2023-07-30 MED ORDER — FENTANYL CITRATE (PF) 250 MCG/5ML IJ SOLN
INTRAMUSCULAR | Status: AC
  Filled 2023-07-30: qty 5

## 2023-07-30 MED ORDER — DEXAMETHASONE SODIUM PHOSPHATE 10 MG/ML IJ SOLN
INTRAMUSCULAR | Status: AC
  Filled 2023-07-30: qty 1

## 2023-07-30 MED ORDER — HYDROMORPHONE HCL 1 MG/ML IJ SOLN
INTRAMUSCULAR | Status: DC | PRN
  Administered 2023-07-30 (×2): .25 mg via INTRAVENOUS

## 2023-07-30 MED ORDER — 0.9 % SODIUM CHLORIDE (POUR BTL) OPTIME
TOPICAL | Status: DC | PRN
  Administered 2023-07-30: 1000 mL

## 2023-07-30 MED ORDER — OXYCODONE HCL 5 MG/5ML PO SOLN: 5.0000 mg | Freq: Once | ORAL | Status: DC | PRN

## 2023-07-30 MED ORDER — ROCURONIUM BROMIDE 10 MG/ML (PF) SYRINGE
PREFILLED_SYRINGE | INTRAVENOUS | Status: AC
Start: 2023-07-30 — End: ?
  Filled 2023-07-30: qty 10

## 2023-07-30 MED ORDER — KETAMINE HCL 50 MG/5ML IJ SOSY
PREFILLED_SYRINGE | INTRAMUSCULAR | Status: AC
  Filled 2023-07-30: qty 5

## 2023-07-30 MED ORDER — KETOROLAC TROMETHAMINE 30 MG/ML IJ SOLN
INTRAMUSCULAR | Status: AC
  Filled 2023-07-30: qty 1

## 2023-07-30 MED ORDER — ROCURONIUM BROMIDE 10 MG/ML (PF) SYRINGE
PREFILLED_SYRINGE | INTRAVENOUS | Status: DC | PRN
  Administered 2023-07-30: 60 mg via INTRAVENOUS
  Administered 2023-07-30: 20 mg via INTRAVENOUS

## 2023-07-30 MED ORDER — ONDANSETRON HCL 4 MG/2ML IJ SOLN
INTRAMUSCULAR | Status: DC | PRN
  Administered 2023-07-30: 4 mg via INTRAVENOUS

## 2023-07-30 MED ORDER — CHLORHEXIDINE GLUCONATE CLOTH 2 % EX PADS
6.0000 | MEDICATED_PAD | Freq: Once | CUTANEOUS | Status: DC
Start: 2023-07-30 — End: 2023-07-30

## 2023-07-30 MED ORDER — ORAL CARE MOUTH RINSE: 15.0000 mL | Freq: Once | OROMUCOSAL | Status: AC

## 2023-07-30 MED ORDER — AMISULPRIDE (ANTIEMETIC) 5 MG/2ML IV SOLN: 10.0000 mg | Freq: Once | INTRAVENOUS | Status: DC | PRN

## 2023-07-30 MED ORDER — PROPOFOL 10 MG/ML IV BOLUS
INTRAVENOUS | Status: DC | PRN
  Administered 2023-07-30: 150 mg via INTRAVENOUS

## 2023-07-30 MED ORDER — SUGAMMADEX SODIUM 200 MG/2ML IV SOLN
INTRAVENOUS | Status: DC | PRN
  Administered 2023-07-30: 160 mg via INTRAVENOUS

## 2023-07-30 MED ORDER — LIDOCAINE 2% (20 MG/ML) 5 ML SYRINGE
INTRAMUSCULAR | Status: AC
  Filled 2023-07-30: qty 5

## 2023-07-30 MED ORDER — MIDAZOLAM HCL 2 MG/2ML IJ SOLN
INTRAMUSCULAR | Status: DC | PRN
  Administered 2023-07-30: 2 mg via INTRAVENOUS

## 2023-07-30 MED ORDER — HYDROMORPHONE HCL 1 MG/ML IJ SOLN
INTRAMUSCULAR | Status: AC
  Filled 2023-07-30: qty 0.5

## 2023-07-30 MED ORDER — PHENYLEPHRINE 80 MCG/ML (10ML) SYRINGE FOR IV PUSH (FOR BLOOD PRESSURE SUPPORT)
PREFILLED_SYRINGE | INTRAVENOUS | Status: DC | PRN
  Administered 2023-07-30 (×4): 80 ug via INTRAVENOUS

## 2023-07-30 MED ORDER — ONDANSETRON HCL 4 MG/2ML IJ SOLN: 4.0000 mg | Freq: Once | INTRAMUSCULAR | Status: DC | PRN

## 2023-07-30 SURGICAL SUPPLY — 45 items
APPLIER CLIP ROT 10 11.4 M/L (STAPLE) ×1
BAG COUNTER SPONGE SURGICOUNT (BAG) ×1 IMPLANT
BENZOIN TINCTURE PRP APPL 2/3 (GAUZE/BANDAGES/DRESSINGS) ×1 IMPLANT
BLADE CLIPPER SURG (BLADE) IMPLANT
CANISTER SUCT 3000ML PPV (MISCELLANEOUS) ×1 IMPLANT
CHLORAPREP W/TINT 26 (MISCELLANEOUS) ×1 IMPLANT
CLIP APPLIE ROT 10 11.4 M/L (STAPLE) ×1 IMPLANT
COVER MAYO STAND STRL (DRAPES) ×1 IMPLANT
COVER SURGICAL LIGHT HANDLE (MISCELLANEOUS) ×1 IMPLANT
DRAPE C-ARM 42X120 X-RAY (DRAPES) ×1 IMPLANT
DRSG TEGADERM 2-3/8X2-3/4 SM (GAUZE/BANDAGES/DRESSINGS) ×3 IMPLANT
DRSG TEGADERM 4X4.5 CHG (GAUZE/BANDAGES/DRESSINGS) IMPLANT
DRSG TEGADERM 4X4.75 (GAUZE/BANDAGES/DRESSINGS) ×1 IMPLANT
ELECT REM PT RETURN 9FT ADLT (ELECTROSURGICAL) ×1
ELECTRODE REM PT RTRN 9FT ADLT (ELECTROSURGICAL) ×1 IMPLANT
GAUZE SPONGE 2X2 8PLY STRL LF (GAUZE/BANDAGES/DRESSINGS) ×1 IMPLANT
GAUZE SPONGE 2X2 STRL 8-PLY (GAUZE/BANDAGES/DRESSINGS) IMPLANT
GLOVE BIO SURGEON STRL SZ7 (GLOVE) ×1 IMPLANT
GLOVE BIOGEL PI IND STRL 7.5 (GLOVE) ×1 IMPLANT
GOWN STRL REUS W/ TWL LRG LVL3 (GOWN DISPOSABLE) ×3 IMPLANT
IRRIG SUCT STRYKERFLOW 2 WTIP (MISCELLANEOUS) ×1
IRRIGATION SUCT STRKRFLW 2 WTP (MISCELLANEOUS) ×1 IMPLANT
KIT BASIN OR (CUSTOM PROCEDURE TRAY) ×1 IMPLANT
KIT IMAGING PINPOINTPAQ (MISCELLANEOUS) IMPLANT
KIT TURNOVER KIT B (KITS) ×1 IMPLANT
NS IRRIG 1000ML POUR BTL (IV SOLUTION) ×1 IMPLANT
PAD ARMBOARD 7.5X6 YLW CONV (MISCELLANEOUS) ×1 IMPLANT
POUCH RETRIEVAL ECOSAC 10 (ENDOMECHANICALS) IMPLANT
SCISSORS LAP 5X35 DISP (ENDOMECHANICALS) ×1 IMPLANT
SET CHOLANGIOGRAPH 5 50 .035 (SET/KITS/TRAYS/PACK) ×1 IMPLANT
SET TUBE SMOKE EVAC HIGH FLOW (TUBING) ×1 IMPLANT
SLEEVE Z-THREAD 5X100MM (TROCAR) ×1 IMPLANT
SPECIMEN JAR SMALL (MISCELLANEOUS) ×1 IMPLANT
STRIP CLOSURE SKIN 1/2X4 (GAUZE/BANDAGES/DRESSINGS) ×1 IMPLANT
SUT MNCRL AB 4-0 PS2 18 (SUTURE) ×1 IMPLANT
SYS BAG RETRIEVAL 10MM (BASKET) ×1
SYSTEM BAG RETRIEVAL 10MM (BASKET) IMPLANT
TOWEL GREEN STERILE (TOWEL DISPOSABLE) ×1 IMPLANT
TOWEL GREEN STERILE FF (TOWEL DISPOSABLE) ×1 IMPLANT
TRAY LAPAROSCOPIC MC (CUSTOM PROCEDURE TRAY) ×1 IMPLANT
TROCAR 11X100 Z THREAD (TROCAR) ×1 IMPLANT
TROCAR BALLN 12MMX100 BLUNT (TROCAR) ×1 IMPLANT
TROCAR Z-THREAD OPTICAL 5X100M (TROCAR) ×1 IMPLANT
WARMER LAPAROSCOPE (MISCELLANEOUS) ×1 IMPLANT
WATER STERILE IRR 1000ML POUR (IV SOLUTION) ×1 IMPLANT

## 2023-07-30 NOTE — Anesthesia Preprocedure Evaluation (Addendum)
Anesthesia Evaluation  Patient identified by MRN, date of birth, ID band Patient awake    Reviewed: Allergy & Precautions, H&P , NPO status , Patient's Chart, lab work & pertinent test results  History of Anesthesia Complications (+) Family history of anesthesia reaction and history of anesthetic complications (father prolonged emergence)  Airway Mallampati: I  TM Distance: >3 FB Neck ROM: Full    Dental  (+) Teeth Intact, Dental Advisory Given   Pulmonary former smoker Snores at night, no witnessed apneas   Pulmonary exam normal breath sounds clear to auscultation       Cardiovascular negative cardio ROS Normal cardiovascular exam Rhythm:Regular Rate:Normal     Neuro/Psych negative neurological ROS  negative psych ROS   GI/Hepatic negative GI ROS,,,(+)       alcohol use  Endo/Other  negative endocrine ROS    Renal/GU negative Renal ROS  negative genitourinary   Musculoskeletal negative musculoskeletal ROS (+)    Abdominal   Peds negative pediatric ROS (+)  Hematology negative hematology ROS (+) Hb 13.6, plt 268   Anesthesia Other Findings   Reproductive/Obstetrics negative OB ROS                             Anesthesia Physical Anesthesia Plan  ASA: 1  Anesthesia Plan: General   Post-op Pain Management: Tylenol PO (pre-op)*, Toradol IV (intra-op)*, Ketamine IV* and Dilaudid IV   Induction: Intravenous  PONV Risk Score and Plan: 3 and Ondansetron, Dexamethasone, Midazolam and Treatment may vary due to age or medical condition  Airway Management Planned: Oral ETT  Additional Equipment: None  Intra-op Plan:   Post-operative Plan: Extubation in OR  Informed Consent: I have reviewed the patients History and Physical, chart, labs and discussed the procedure including the risks, benefits and alternatives for the proposed anesthesia with the patient or authorized representative  who has indicated his/her understanding and acceptance.     Dental advisory given  Plan Discussed with: CRNA  Anesthesia Plan Comments:        Anesthesia Quick Evaluation
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
## 2023-07-30 NOTE — Transfer of Care (Signed)
Immediate Anesthesia Transfer of Care Note  Patient: Mathew Ramos  Procedure(s) Performed: LAPAROSCOPIC CHOLECYSTECTOMY WITH INTRAOPERATIVE CHOLANGIOGRAM (Abdomen) LAPAROSCOPIC LYSIS OF ADHESIONS (Abdomen)  Patient Location: PACU  Anesthesia Type:General  Level of Consciousness: drowsy and responds to stimulation  Airway & Oxygen Therapy: Patient Spontanous Breathing and Patient connected to face mask oxygen  Post-op Assessment: Report given to RN and Post -op Vital signs reviewed and stable  Post vital signs: Reviewed and stable  Last Vitals:  Vitals Value Taken Time  BP 129/86 07/30/23 0851  Temp    Pulse 56 07/30/23 0853  Resp 17 07/30/23 0853  SpO2 99 % 07/30/23 0853  Vitals shown include unfiled device data.  Last Pain:  Vitals:   07/30/23 0559  TempSrc:   PainSc: 0-No pain         Complications: No notable events documented.
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
## 2023-07-30 NOTE — Anesthesia Postprocedure Evaluation (Signed)
Anesthesia Post Note  Patient: Mathew Ramos  Procedure(s) Performed: LAPAROSCOPIC CHOLECYSTECTOMY WITH INTRAOPERATIVE CHOLANGIOGRAM (Abdomen) LAPAROSCOPIC LYSIS OF ADHESIONS (Abdomen)     Patient location during evaluation: PACU Anesthesia Type: General Level of consciousness: awake and alert, oriented and patient cooperative Pain management: pain level controlled Vital Signs Assessment: post-procedure vital signs reviewed and stable Respiratory status: spontaneous breathing, nonlabored ventilation and respiratory function stable Cardiovascular status: blood pressure returned to baseline and stable Postop Assessment: no apparent nausea or vomiting Anesthetic complications: no   No notable events documented.  Last Vitals:  Vitals:   07/30/23 0920 07/30/23 0930  BP: 118/80 108/77  Pulse: (!) 58 (!) 53  Resp: 11 16  Temp:    SpO2: 95% 96%    Last Pain:  Vitals:   07/30/23 0920  TempSrc:   PainSc: 0-No pain                 Lannie Fields

## 2023-07-30 NOTE — Discharge Instructions (Signed)

## 2023-07-30 NOTE — Anesthesia Procedure Notes (Signed)
Procedure Name: Intubation Date/Time: 07/30/2023 7:28 AM  Performed by: Ammie Dalton, CRNAPre-anesthesia Checklist: Patient identified, Emergency Drugs available, Suction available and Patient being monitored Patient Re-evaluated:Patient Re-evaluated prior to induction Oxygen Delivery Method: Circle System Utilized Preoxygenation: Pre-oxygenation with 100% oxygen Induction Type: IV induction Ventilation: Mask ventilation without difficulty Laryngoscope Size: Mac and 4 Grade View: Grade I Tube type: Oral Tube size: 7.5 mm Number of attempts: 1 Airway Equipment and Method: Stylet Placement Confirmation: ETT inserted through vocal cords under direct vision, positive ETCO2 and breath sounds checked- equal and bilateral Secured at: 22 cm Tube secured with: Tape Dental Injury: Teeth and Oropharynx as per pre-operative assessment

## 2023-07-31 ENCOUNTER — Encounter (HOSPITAL_COMMUNITY): Payer: Self-pay | Admitting: Surgery

## 2023-07-31 LAB — SURGICAL PATHOLOGY
